# Patient Record
Sex: Male | Born: 1995 | Race: White | Hispanic: No | Marital: Single | Smoking: Never smoker
Health system: Southern US, Community
[De-identification: ages and names within clinical notes are randomized; demographics above are authoritative.]

## PROBLEM LIST (undated history)

## (undated) DIAGNOSIS — J45909 Unspecified asthma, uncomplicated: Secondary | ICD-10-CM

## (undated) DIAGNOSIS — D689 Coagulation defect, unspecified: Secondary | ICD-10-CM

## (undated) DIAGNOSIS — M86171 Other acute osteomyelitis, right ankle and foot: Secondary | ICD-10-CM

## (undated) DIAGNOSIS — G8221 Paraplegia, complete: Secondary | ICD-10-CM

## (undated) HISTORY — PX: ORIF HUMERUS FRACTURE: SHX2126

## (undated) HISTORY — DX: Paraplegia, complete: G82.21

## (undated) HISTORY — PX: ANTERIOR CERVICAL CORPECTOMY: SHX1159

## (undated) HISTORY — PX: FOOT SURGERY: SHX648

## (undated) HISTORY — PX: WISDOM TOOTH EXTRACTION: SHX21

## (undated) HISTORY — DX: Other acute osteomyelitis, right ankle and foot: M86.171

## (undated) HISTORY — DX: Coagulation defect, unspecified: D68.9

---
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
## 2015-04-06 ENCOUNTER — Other Ambulatory Visit: Payer: Self-pay | Admitting: Allergy and Immunology

## 2015-04-10 ENCOUNTER — Ambulatory Visit: Payer: Self-pay | Admitting: Allergy and Immunology

## 2016-03-15 ENCOUNTER — Encounter: Payer: BLUE CROSS/BLUE SHIELD | Attending: Family Medicine | Admitting: Skilled Nursing Facility1

## 2016-03-15 ENCOUNTER — Encounter: Payer: Self-pay | Admitting: Skilled Nursing Facility1

## 2016-03-15 DIAGNOSIS — E669 Obesity, unspecified: Secondary | ICD-10-CM | POA: Insufficient documentation

## 2016-03-15 DIAGNOSIS — Z713 Dietary counseling and surveillance: Secondary | ICD-10-CM | POA: Insufficient documentation

## 2016-03-15 DIAGNOSIS — E6609 Other obesity due to excess calories: Secondary | ICD-10-CM

## 2016-03-15 NOTE — Progress Notes (Signed)
  Medical Nutrition Therapy:  Appt start time: 3:02end time:  3:45   Assessment:  Primary concerns today: referred for obesity. Pt states he has been trying to lose wt for the past year. Pt states he was down to 160 pounds and is up to 211 pounds since March. Pt states 2-3 months 3 times a week but then stopped going to the gym for a few months while looking for work.  Pt states he plays on the computer a lot and sleeps about 10 hours waking at 4pm but has recently been changing his sleep to wake at 11pm.   Preferred Learning Style:   No preference indicated   Learning Readiness:   Contemplating  MEDICATIONS: See List   DIETARY INTAKE:  Usual eating pattern includes 2 meals and 0 snacks per day.  Everyday foods include none stated.  Avoided foods include none stated.    24-hr recall:  B ( AM): ramen---eggs Snk ( AM):   L ( PM):  Snk ( PM):  D ( PM): baked chicken---pasta with cream sauce-----vegetables Snk ( PM):  Beverages: water  Usual physical activity: ADL's *Meals outside the home: 2-3 meals a week  Estimated energy needs: 1800 calories 200 g carbohydrates 135 g protein 50 g fat  Progress Towards Goal(s):  In progress.   Nutritional Diagnosis:  Anton-3.3 Overweight/obesity As related to inconsistant meal times/physical inactviity.  As evidenced by pt report, 24 hr recall, and BMI 36.27.    Intervention:  Nutrition counseling for obesity. Dietitian educated the pt on balanced meals, meal frequency, and the importance of physical activity. Goals: -Keep using the weights at your computer  -Try to get to the gym 3 days a week 30-60 minutes   -Reevaluate the frequency, time frame, intensity of your workouts   -Keep working on your sleep schedule   -Try to eat within an hour to hour and a half of waking   --Eat 3 meals a day and snacks in between (if you are hungry for the snacks) -A meal: carbohydrate, protein, vegetable -A snack: A Fruit OR Vegetable AND  Protein -Honor your body by listening to your hunger and fullness cues -First thought: Am I hungry? -Second thought: (if the answer is yes I am hungry) Does this meal have vegetables? Protein? Carbohydrate?  -Third thought: Are there more vegetables on my plate compared to Protein and Carbohydrates? -After you have finished your first serving Do Not go back for more until you have waited 20-30 minutes and checked in with your body by asking Am I Still Hungry?   Teaching Method Utilized:  Visual Auditory Hands on  Handouts given during visit include:  Should I eat flow sheet  Barriers to learning/adherence to lifestyle change: none identified   Demonstrated degree of understanding via:  Teach Back   Monitoring/Evaluation:  Dietary intake, exercise, and body weight prn.

## 2016-03-15 NOTE — Patient Instructions (Signed)
-  Keep using the weights at your computer  -Try to get to the gym 3 days a week 30-60 minutes   -Reevaluate the frequency, time frame, intensity of your workouts   -Keep working on your sleep schedule   -Try to eat within an hour to hour and a half of waking   --Eat 3 meals a day and snacks in between (if you are hungry for the snacks) -A meal: carbohydrate, protein, vegetable -A snack: A Fruit OR Vegetable AND Protein -Honor your body by listening to your hunger and fullness cues -First thought: Am I hungry? -Second thought: (if the answer is yes I am hungry) Does this meal have vegetables? Protein? Carbohydrate?  -Third thought: Are there more vegetables on my plate compared to Protein and Carbohydrates? -After you have finished your first serving Do Not go back for more until you have waited 20-30 minutes and checked in with your body by asking Am I Still Hungry?

## 2016-06-24 ENCOUNTER — Ambulatory Visit (INDEPENDENT_AMBULATORY_CARE_PROVIDER_SITE_OTHER): Payer: BLUE CROSS/BLUE SHIELD | Admitting: Allergy and Immunology

## 2016-06-24 ENCOUNTER — Encounter: Payer: Self-pay | Admitting: Allergy and Immunology

## 2016-06-24 ENCOUNTER — Encounter (INDEPENDENT_AMBULATORY_CARE_PROVIDER_SITE_OTHER): Payer: Self-pay

## 2016-06-24 VITALS — BP 112/76 | HR 85 | Temp 98.1°F | Resp 17 | Ht 64.0 in | Wt 218.0 lb

## 2016-06-24 DIAGNOSIS — J4520 Mild intermittent asthma, uncomplicated: Secondary | ICD-10-CM | POA: Diagnosis not present

## 2016-06-24 DIAGNOSIS — J3089 Other allergic rhinitis: Secondary | ICD-10-CM

## 2016-06-24 MED ORDER — AZELASTINE-FLUTICASONE 137-50 MCG/ACT NA SUSP: 1.0000 | Freq: Two times a day (BID) | NASAL | 5 refills | Status: DC | PRN

## 2016-06-24 MED ORDER — AZELASTINE-FLUTICASONE 137-50 MCG/ACT NA SUSP: 1.0000 | Freq: Two times a day (BID) | NASAL | 3 refills | Status: DC | PRN

## 2016-06-24 NOTE — Progress Notes (Signed)
Follow-up Note  RE: Ivory BroadJoshua N Creps MRN: [id number] DOB: 02/22/1996 Date of Office Visit: 06/24/2016  Primary care provider: Iva BoopVIA, KEVIN, MD Referring provider: No ref. provider found  History of present illness: Dellis AnesJoshua Coker is a 21 y.o. male with persistent asthma and allergic rhinoconjunctivitis presenting today for follow up.  He was last seen in this clinic in August 2016 by Dr. Willa RoughHicks, who has since left the practice.  He has been experiencing persistent nasal congestion over the past month.  Atrovent nasal spray and cetirizine have not provided adequate symptom relief.  He had the flu last week and took Tamiflu.  He reports that his asthma has been well-controlled.  He requires albuterol rescue 1 or 2 times per week on average.   Assessment and plan: Mild intermittent asthma  Continue albuterol HFA, 1-2 inhalations every 4-6 hours as needed.  Subjective and objective measures of pulmonary function will be followed and the treatment plan will be adjusted accordingly.  Allergic rhinitis  Continue appropriate allergen avoidance measures.  A prescription has been provided for Dymista (azelastine/fluticasone) nasal spray, 1 spray per nostril twice daily as needed. Proper nasal spray technique has been discussed and demonstrated.  Nasal saline lavage (NeilMed) has been recommended prior to medicated nasal sprays and as needed along with instructions for proper administration.  For thick post nasal drainage, nasal congestion, and/or sinus pressure, add guaifenesin 1200 mg (Mucinex Maximum Strength) plus/minus pseudoephedrine 120 mg  twice daily as needed with adequate hydration as discussed. Pseudoephedrine is only to be used for short-term relief of nasal/sinus congestion. Long-term use is discouraged due to potential side effects.   Meds ordered this encounter  Medications  . DISCONTD: Azelastine-Fluticasone 137-50 MCG/ACT SUSP    Sig: Place 1 spray into the nose 2 (two)  times daily as needed.    Dispense:  1 Bottle    Refill:  5  . Azelastine-Fluticasone 137-50 MCG/ACT SUSP    Sig: Place 1 spray into the nose 2 (two) times daily as needed.    Dispense:  1 Bottle    Refill:  3    Diagnostics: Spirometry:  Normal with an FEV1 of 89% predicted.  Please see scanned spirometry results for details.    Physical examination: Blood pressure 112/76, pulse 85, temperature 98.1 F (36.7 C), temperature source Oral, resp. rate 17, height 5\' 4"  (1.626 m), weight 218 lb (98.9 kg), SpO2 97 %.  General: Alert, interactive, in no acute distress. HEENT: TMs pearly gray, turbinates edematous with clear discharge, post-pharynx mildly erythematous. Neck: Supple without lymphadenopathy. Lungs: Clear to auscultation without wheezing, rhonchi or rales. CV: Normal S1, S2 without murmurs. Skin: Warm and dry, without lesions or rashes.  The following portions of the patient's history were reviewed and updated as appropriate: allergies, current medications, past family history, past medical history, past social history, past surgical history and problem list.  Allergies as of 06/24/2016      Reactions   Cephalexin    Penicillins    Mom is allergic      Medication List       Accurate as of 06/24/16  5:07 PM. Always use your most recent med list.          Azelastine-Fluticasone 137-50 MCG/ACT Susp Place 1 spray into the nose 2 (two) times daily as needed.   cetirizine 10 MG tablet Commonly known as:  ZYRTEC TK 1 T PO QD   ipratropium 0.06 % nasal spray Commonly known as:  ATROVENT  ONE-A-DAY TEEN ADVANTAGE/HIM PO Take 1 tablet by mouth daily.   PROAIR HFA 108 (90 Base) MCG/ACT inhaler Generic drug:  albuterol INHALE 2 PUFFS BY MOUTH EVERY 4 TO 6 HOURS AS NEEDED FOR COUGH OR WHEEZE. MAY USE 2 PUFFS 10 TO 20 MINTUES PRIOR TO EXERCISE       Allergies  Allergen Reactions  . Cephalexin   . Penicillins     Mom is allergic    I appreciate the  opportunity to take part in Dewayne's care. Please do not hesitate to contact me with questions.  Sincerely,   R. Jorene Guest, MD

## 2016-06-24 NOTE — Assessment & Plan Note (Signed)
   Continue albuterol HFA, 1-2 inhalations every 4-6 hours as needed.  Subjective and objective measures of pulmonary function will be followed and the treatment plan will be adjusted accordingly. 

## 2016-06-24 NOTE — Patient Instructions (Addendum)
Mild intermittent asthma  Continue albuterol HFA, 1-2 inhalations every 4-6 hours as needed.  Subjective and objective measures of pulmonary function will be followed and the treatment plan will be adjusted accordingly.  Allergic rhinitis  Continue appropriate allergen avoidance measures.  A prescription has been provided for Dymista (azelastine/fluticasone) nasal spray, 1 spray per nostril twice daily as needed. Proper nasal spray technique has been discussed and demonstrated.  Nasal saline lavage (NeilMed) has been recommended prior to medicated nasal sprays and as needed along with instructions for proper administration.  For thick post nasal drainage, nasal congestion, and/or sinus pressure, add guaifenesin 1200 mg (Mucinex Maximum Strength) plus/minus pseudoephedrine 120 mg  twice daily as needed with adequate hydration as discussed. Pseudoephedrine is only to be used for short-term relief of nasal/sinus congestion. Long-term use is discouraged due to potential side effects.   Return in about 6 months (around 12/22/2016), or if symptoms worsen or fail to improve.

## 2016-06-24 NOTE — Assessment & Plan Note (Signed)
   Continue appropriate allergen avoidance measures.  A prescription has been provided for Dymista (azelastine/fluticasone) nasal spray, 1 spray per nostril twice daily as needed. Proper nasal spray technique has been discussed and demonstrated.  Nasal saline lavage (NeilMed) has been recommended prior to medicated nasal sprays and as needed along with instructions for proper administration.  For thick post nasal drainage, nasal congestion, and/or sinus pressure, add guaifenesin 1200 mg (Mucinex Maximum Strength) plus/minus pseudoephedrine 120 mg  twice daily as needed with adequate hydration as discussed. Pseudoephedrine is only to be used for short-term relief of nasal/sinus congestion. Long-term use is discouraged due to potential side effects.

## 2016-06-28 ENCOUNTER — Other Ambulatory Visit: Payer: Self-pay | Admitting: Allergy and Immunology

## 2016-06-28 NOTE — Telephone Encounter (Signed)
Do you want me to just split the prescriptions.

## 2016-06-28 NOTE — Telephone Encounter (Signed)
patients insurance will NOT pay for new nasal spray   - patients states that the pharmacy says that [facility] can try and have insurance pay for new nasal spray or patient is fine having the two generic sprays instead.  Patient states he was to call back and let [facility] know if the spray was covered which it is not.  Please call patient to let him know if [facility] will try and get the new spray authorized and covered - or will the other two sprays be called in Patient wants to switch pharmacies to [facility] on [address].

## 2016-06-30 ENCOUNTER — Other Ambulatory Visit: Payer: Self-pay | Admitting: *Deleted

## 2016-06-30 MED ORDER — AZELASTINE HCL 0.15 % NA SOLN: 2.0000 | Freq: Two times a day (BID) | NASAL | 5 refills | Status: DC

## 2016-06-30 MED ORDER — FLUTICASONE PROPIONATE 50 MCG/ACT NA SUSP: 1.0000 | Freq: Every day | NASAL | 5 refills | Status: DC

## 2016-07-14 ENCOUNTER — Other Ambulatory Visit: Payer: Self-pay | Admitting: *Deleted

## 2016-07-23 NOTE — Telephone Encounter (Signed)
I am sorry, this must have went to the wrong person. Please advise and thank you.

## 2016-07-26 NOTE — Telephone Encounter (Signed)
Yes, give a script for azelastine and fluticasone nasal sprays. Thanks.

## 2016-07-26 NOTE — Telephone Encounter (Signed)
Medications have already been sent

## 2018-10-05 ENCOUNTER — Encounter: Payer: Self-pay | Admitting: Allergy and Immunology

## 2018-10-05 ENCOUNTER — Other Ambulatory Visit: Payer: Self-pay

## 2018-10-05 ENCOUNTER — Ambulatory Visit (INDEPENDENT_AMBULATORY_CARE_PROVIDER_SITE_OTHER): Payer: BLUE CROSS/BLUE SHIELD | Admitting: Allergy and Immunology

## 2018-10-05 VITALS — BP 122/78 | HR 72 | Temp 97.8°F | Resp 22 | Ht 65.5 in | Wt 223.2 lb

## 2018-10-05 DIAGNOSIS — J3089 Other allergic rhinitis: Secondary | ICD-10-CM | POA: Diagnosis not present

## 2018-10-05 DIAGNOSIS — J301 Allergic rhinitis due to pollen: Secondary | ICD-10-CM

## 2018-10-05 DIAGNOSIS — J4530 Mild persistent asthma, uncomplicated: Secondary | ICD-10-CM | POA: Diagnosis not present

## 2018-10-05 DIAGNOSIS — L858 Other specified epidermal thickening: Secondary | ICD-10-CM | POA: Diagnosis not present

## 2018-10-05 MED ORDER — MONTELUKAST SODIUM 10 MG PO TABS: 10.0000 mg | ORAL_TABLET | Freq: Every day | ORAL | 5 refills | Status: DC

## 2018-10-05 MED ORDER — AMMONIUM LACTATE 12 % EX CREA: TOPICAL_CREAM | CUTANEOUS | 3 refills | Status: DC

## 2018-10-05 NOTE — Progress Notes (Signed)
[facility] [place]   Follow-up Note  Referring Provider: ViaCaryn Bee, Kevin, MD Primary Provider: Ayesha RumpfYonjof, Mary, FNP Date of Office Visit: 10/05/2018  Subjective:   Ian Mcdaniel (DOB: 10/23/1995) is a 23 y.o. male who [facility] in re-evaluation of the following:  HPI: Ian Mcdaniel returns to this clinic in evaluation of asthma and allergic rhinoconjunctivitis.  He was last seen in this clinic by Dr. Nunzio CobbsBobbitt on 22 August 2016.  Ian Mcdaniel has been having problems with his asthma.  His requirement for short acting bronchodilator sometimes goes up to twice a day.  His symptoms occur on a perennial basis but definitely flare at the beginning of the spring and the beginning of the fall.  It does not sound as though he has required a systemic steroid to treat an exacerbation of his asthma in years.  He does not use a controller agent.  He also has issues with intermittent nasal congestion and sneezing and bags around his eyes.  He only uses nasal fluticasone intermittently.  He does continue on an antihistamine.  He continues to have problems with keratosis pilaris.  He is not using any type of therapy for this issue.  Allergies as of 10/05/2018      Reactions   Cephalexin    Penicillins    Mom is allergic      Medication List      cetirizine 10 MG tablet Commonly known as:  ZYRTEC TK 1 T PO QD   fluticasone 50 MCG/ACT nasal spray Commonly known as:  FLONASE Place 1-2 sprays into both nostrils daily.   ProAir HFA 108 (90 Base) MCG/ACT inhaler Generic drug:  albuterol INHALE 2 PUFFS BY MOUTH EVERY 4 TO 6 HOURS AS NEEDED FOR COUGH OR WHEEZE. MAY USE 2 PUFFS 10 TO 20 MINTUES PRIOR TO EXERCISE   PROBIOTIC DAILY PO Take by mouth.       Past Medical History:  Diagnosis Date  . Asthma     History reviewed. No pertinent surgical history.  Review of systems negative except as noted in HPI / PMHx or noted  below:  Review of Systems  Constitutional: Negative.   HENT: Negative.   Eyes: Negative.   Respiratory: Negative.   Cardiovascular: Negative.   Gastrointestinal: Negative.   Genitourinary: Negative.   Musculoskeletal: Negative.   Skin: Negative.   Neurological: Negative.   Endo/Heme/Allergies: Negative.   Psychiatric/Behavioral: Negative.      Objective:   Vitals:   10/05/18 1557  BP: 122/78  Pulse: 72  Resp: (!) 22  Temp: 97.8 F (36.6 C)  SpO2: 96%   Height: 5' 5.5" (166.4 cm)  Weight: 223 lb 3.2 oz (101.2 kg)   Physical Exam Constitutional:      Appearance: He is not diaphoretic.  HENT:     Head: Normocephalic.     Right Ear: Tympanic membrane, ear canal and external ear normal.     Left Ear: Tympanic membrane, ear canal and external ear normal.     Nose: Nose normal. No mucosal edema or rhinorrhea.     Mouth/Throat:     Pharynx: Uvula midline. No oropharyngeal exudate.  Eyes:     Conjunctiva/sclera: Conjunctivae normal.  Neck:     Thyroid: No thyromegaly.     Trachea: Trachea normal. No tracheal tenderness or tracheal deviation.  Cardiovascular:     Rate and Rhythm: Normal rate and regular rhythm.     Heart  sounds: Normal heart sounds, S1 normal and S2 normal. No murmur.  Pulmonary:     Effort: No respiratory distress.     Breath sounds: Normal breath sounds. No stridor. No wheezing or rales.  Lymphadenopathy:     Head:     Right side of head: No tonsillar adenopathy.     Left side of head: No tonsillar adenopathy.     Cervical: No cervical adenopathy.  Skin:    Findings: Rash (Keratosis pilaris posterior upper arms bilaterally) present. No erythema.     Nails: There is no clubbing.   Neurological:     Mental Status: He is alert.     Diagnostics:    Spirometry was performed and demonstrated an FEV1 of 3.29 at 80 % of predicted.  Assessment and Plan:   1. Not well controlled mild persistent asthma   2. Perennial allergic rhinitis   3.  Seasonal allergic rhinitis due to pollen   4. Keratosis pilaris     1.  Treat and prevent inflammation:   A.  OTC Flonase-1 spray each nostril 3-7 times a week  B.  Montelukast 10 mg- 1 tablet daily  2.  If needed:   A.  OTC antihistamine-cetirizine 10 mg daily  B.  Albuterol HFA 2 inhalations every 4-6 hours  C.  OTC Pataday 1 drop each eye 1 time per day  D.  Lac-Hydrin applied to keratosis pilaris daily  3.  Further evaluation for asthma activity?  4.  Return to clinic summer 2020 or earlier if problem  Rodricus appears to have some activity of his atopic respiratory disease and we will treat him with a combination of Flonase and montelukast used on a consistent basis.  He has several other medications that he can utilize on an as-needed basis.  Assuming he does well I will see him back in this clinic in the summer 2020 or earlier if there is a problem.  Laurette Schimke, MD Allergy / Immunology [facility]

## 2018-10-05 NOTE — Patient Instructions (Addendum)
  1.  Treat and prevent inflammation:   A.  OTC Flonase-1 spray each nostril 3-7 times a week  B.  Montelukast 10 mg- 1 tablet daily  2.  If needed:   A.  OTC antihistamine-cetirizine 10 mg daily  B.  Albuterol HFA 2 inhalations every 4-6 hours  C.  OTC Pataday 1 drop each eye 1 time per day  D.  Lac-Hydrin ointment applied to keratosis pilaris daily  3.  Further evaluation for asthma activity?  4.  Return to clinic summer 2020 or earlier if problem

## 2018-10-09 ENCOUNTER — Encounter: Payer: Self-pay | Admitting: Allergy and Immunology

## 2018-11-08 ENCOUNTER — Encounter: Payer: Self-pay | Admitting: Allergy and Immunology

## 2018-11-08 ENCOUNTER — Other Ambulatory Visit: Payer: Self-pay

## 2018-11-08 ENCOUNTER — Telehealth: Payer: Self-pay | Admitting: Allergy and Immunology

## 2018-11-08 ENCOUNTER — Ambulatory Visit (INDEPENDENT_AMBULATORY_CARE_PROVIDER_SITE_OTHER): Payer: BC Managed Care – PPO | Admitting: Allergy and Immunology

## 2018-11-08 VITALS — BP 120/72 | HR 69 | Temp 98.6°F | Resp 18

## 2018-11-08 DIAGNOSIS — L858 Other specified epidermal thickening: Secondary | ICD-10-CM

## 2018-11-08 DIAGNOSIS — J301 Allergic rhinitis due to pollen: Secondary | ICD-10-CM | POA: Diagnosis not present

## 2018-11-08 DIAGNOSIS — J3089 Other allergic rhinitis: Secondary | ICD-10-CM | POA: Diagnosis not present

## 2018-11-08 DIAGNOSIS — J4530 Mild persistent asthma, uncomplicated: Secondary | ICD-10-CM | POA: Diagnosis not present

## 2018-11-08 MED ORDER — ARNUITY ELLIPTA 100 MCG/ACT IN AEPB: 1.0000 | INHALATION_SPRAY | Freq: Every day | RESPIRATORY_TRACT | 5 refills | Status: DC

## 2018-11-08 NOTE — Progress Notes (Signed)
[place] [facility] [place]   Follow-up Note  Referring Provider: Christa See, FNP Primary Provider: Christa See, FNP Date of Office Visit: 11/08/2018  Subjective:   Ian Mcdaniel (DOB: October 09, 1995) is a 23 y.o. male who returns to the [facility] [place] on 11/08/2018 in re-evaluation of the following:  HPI: Ian Mcdaniel returns to this clinic in reevaluation of his asthma and allergic rhinitis and keratosis pilaris.  I last saw him in this clinic on 05 October 2018 at which point in time he was having active airway disease.  We started him on a nasal steroid and montelukast at that point.  Unfortunately, he still continues to have intermittent coughing and he does not really think that montelukast has helped him very much.  His requirement for bronchodilator is about 5 times per week.  He has not required the administration of a systemic steroid to treat an exacerbation of asthma.  His nose may be slightly better with the use of the nasal steroid.  Fortunately, there is no significant respiratory tract symptoms to suggest an ongoing episode of sinusitis.  Allergies as of 11/08/2018      Reactions   Cephalexin    Penicillins    Mom is allergic      Medication List      ammonium lactate 12 % cream Commonly known as: AMLACTIN Can apply to keratosis pilaris once daily if needed.     cetirizine 10 MG tablet Commonly known as: ZYRTEC TK 1 T PO QD   fluticasone 50 MCG/ACT nasal spray Commonly known as: FLONASE Place 1-2 sprays into both nostrils daily.   montelukast 10 MG tablet Commonly known as: SINGULAIR Take 1 tablet (10 mg total) by mouth at bedtime.   ProAir HFA 108 (90 Base) MCG/ACT inhaler Generic drug: albuterol INHALE 2 PUFFS BY MOUTH EVERY 4 TO 6 HOURS AS NEEDED FOR COUGH OR WHEEZE. MAY USE 2 PUFFS 10 TO 20 MINTUES PRIOR TO EXERCISE   PROBIOTIC DAILY PO Take by mouth.       Past Medical History:  Diagnosis Date  . Asthma      History reviewed. No pertinent surgical history.  Review of systems negative except as noted in HPI / PMHx or noted below:  Review of Systems  Constitutional: Negative.   HENT: Negative.   Eyes: Negative.   Respiratory: Negative.   Cardiovascular: Negative.   Gastrointestinal: Negative.   Genitourinary: Negative.   Musculoskeletal: Negative.   Skin: Negative.   Neurological: Negative.   Endo/Heme/Allergies: Negative.   Psychiatric/Behavioral: Negative.      Objective:   Vitals:   11/08/18 1135  BP: 120/72  Pulse: 69  Resp: 18  Temp: 98.6 F (37 C)  SpO2: 96%          Physical Exam Constitutional:      Appearance: He is not diaphoretic.  HENT:     Head: Normocephalic.     Right Ear: Tympanic membrane, ear canal and external ear normal.     Left Ear: Tympanic membrane, ear canal and external ear normal.     Nose: Nose normal. No mucosal edema or rhinorrhea.     Mouth/Throat:     Pharynx: Uvula midline. No oropharyngeal exudate.  Eyes:     Conjunctiva/sclera: Conjunctivae normal.  Neck:     Thyroid: No thyromegaly.     Trachea: Trachea normal. No tracheal tenderness or tracheal deviation.  Cardiovascular:     Rate and Rhythm: Normal rate  and regular rhythm.     Heart sounds: Normal heart sounds, S1 normal and S2 normal. No murmur.  Pulmonary:     Effort: No respiratory distress.     Breath sounds: Normal breath sounds. No stridor. No wheezing or rales.  Lymphadenopathy:     Head:     Right side of head: No tonsillar adenopathy.     Left side of head: No tonsillar adenopathy.     Cervical: No cervical adenopathy.  Skin:    Findings: No erythema or rash.     Nails: There is no clubbing.   Neurological:     Mental Status: He is alert.     Diagnostics:    Spirometry was performed and demonstrated an FEV1 of 3.02 at 78 % of predicted.   Assessment and Plan:   1. Not well controlled mild persistent asthma   2. Perennial allergic rhinitis   3.  Seasonal allergic rhinitis due to pollen   4. Keratosis pilaris     1.  Treat and prevent inflammation:   A.  OTC Flonase-1 spray each nostril 3-7 times a week  B.  Arnuity 100 - 1 inhalation 1 time per day  C.  Can discontinue montelukast  2.  If needed:   A.  OTC antihistamine-cetirizine 10 mg daily  B.  Albuterol HFA 2 inhalations every 4-6 hours  C.  OTC Pataday 1 drop each eye 1 time per day  D.  Lac-Hydrin ointment applied to keratosis pilaris daily  3.  Return to clinic 8 weeks or earlier if problem  Ian Mcdaniel has not had a good response to initial therapy directed against his respiratory tract inflammatory and we will now have him utilize a nasal steroid and inhaled steroid on a consistent basis for the next several weeks and assess his response to that approach in approximately 8 weeks.  Montelukast does not appear to have resulted in good control of any of his issues and he can discontinue this agent.  I will see him back in this clinic 8 weeks or earlier if there is a problem.  Laurette SchimkeEric Kyion Gautier, MD Allergy / Immunology [facility]

## 2018-11-08 NOTE — Telephone Encounter (Signed)
Ian Mcdaniel came in for his appointment this morning and questioned his bill.  He states that his insurance didn't pay very much.  I asked him if he had a deductible and she stated he wasn't sure.  He didn't understand why the insurance paid so little and why was he responsible for such a huge portion.  Please advise.

## 2018-11-08 NOTE — Patient Instructions (Signed)
  1.  Treat and prevent inflammation:   A.  OTC Flonase-1 spray each nostril 3-7 times a week  B.  Arnuity 100 - 1 inhalation 1 time per day  C.  Can discontinue montelukast  2.  If needed:   A.  OTC antihistamine-cetirizine 10 mg daily  B.  Albuterol HFA 2 inhalations every 4-6 hours  C.  OTC Pataday 1 drop each eye 1 time per day  D.  Lac-Hydrin ointment applied to keratosis pilaris daily  3.  Return to clinic 8 weeks or earlier if problem

## 2018-11-09 ENCOUNTER — Encounter: Payer: Self-pay | Admitting: Allergy and Immunology

## 2018-11-09 NOTE — Telephone Encounter (Signed)
I spoke with Ian Mcdaniel this evening. Thank you.

## 2020-03-10 ENCOUNTER — Other Ambulatory Visit: Payer: Self-pay | Admitting: Allergy and Immunology

## 2020-03-10 NOTE — Telephone Encounter (Signed)
Please inform Ian Mcdaniel that medically likely we need to see him 1 time per year to brought him medications throughout that year.  It does not need to be [place] to renew his medications and if he has seen a additional physician or provider within the past year they may be able to provide him refills of his medications.  We can provide him one courtesy refill without seeing him again.

## 2020-03-10 NOTE — Telephone Encounter (Signed)
Patient is wanting refill on Arnuity. Patient has not been in the office since 11/2018 and does not want to schedule an appointment and stated "is this necessary". Advise patient in order to get an refill he needed the appointment. Patient stated he has not been consist with his medication and lately has had coughing probably due to allergies. I advise per protocol I will have to ask the provider and stated to give him a call back. Please advise.

## 2020-03-11 NOTE — Telephone Encounter (Signed)
Spoke with patient, he stated that he greatly appreciates the courtesy refill. He stated that right now is hard for him to come in for an office visit due to it costing him between $300-$400 and then an additional $50 for filling the prescription. Patient stated he will do his best to get in as soon as possible to continue receiving refills.

## 2020-04-02 ENCOUNTER — Other Ambulatory Visit: Payer: Self-pay | Admitting: Allergy and Immunology

## 2020-04-02 NOTE — Telephone Encounter (Signed)
Patient has appointment to see Nehemiah Settle, FNP on 04/07/2020 at 950 Am

## 2020-04-07 ENCOUNTER — Ambulatory Visit: Payer: Self-pay | Admitting: Family

## 2020-04-10 ENCOUNTER — Ambulatory Visit (INDEPENDENT_AMBULATORY_CARE_PROVIDER_SITE_OTHER): Payer: BC Managed Care – PPO | Admitting: Allergy and Immunology

## 2020-04-10 ENCOUNTER — Encounter: Payer: Self-pay | Admitting: Allergy and Immunology

## 2020-04-10 ENCOUNTER — Other Ambulatory Visit: Payer: Self-pay

## 2020-04-10 VITALS — BP 118/88 | HR 71 | Resp 16 | Ht 64.6 in | Wt 246.0 lb

## 2020-04-10 DIAGNOSIS — J4530 Mild persistent asthma, uncomplicated: Secondary | ICD-10-CM | POA: Diagnosis not present

## 2020-04-10 DIAGNOSIS — J301 Allergic rhinitis due to pollen: Secondary | ICD-10-CM

## 2020-04-10 DIAGNOSIS — J3089 Other allergic rhinitis: Secondary | ICD-10-CM | POA: Diagnosis not present

## 2020-04-10 MED ORDER — ALBUTEROL SULFATE HFA 108 (90 BASE) MCG/ACT IN AERS: INHALATION_SPRAY | RESPIRATORY_TRACT | 1 refills | Status: AC

## 2020-04-10 MED ORDER — MONTELUKAST SODIUM 10 MG PO TABS: 10.0000 mg | ORAL_TABLET | Freq: Every day | ORAL | 3 refills | Status: DC

## 2020-04-10 MED ORDER — ARNUITY ELLIPTA 100 MCG/ACT IN AEPB: INHALATION_SPRAY | RESPIRATORY_TRACT | 3 refills | Status: DC

## 2020-04-10 NOTE — Progress Notes (Signed)
[place]   Follow-up Note  Referring Provider: Ayesha Rumpf, FNP Primary Provider: Ayesha Rumpf, FNP Date of Office Visit: 04/10/2020  Subjective:   Ian Mcdaniel (DOB: 04/09/1996) is a 24 y.o. male [facility] in re-evaluation of the following:  HPI: [name] returns to this clinic in evaluation of asthma and allergic rhinitis and history of keratosis pilaris.  His last visit to this clinic was 08 November 2018.  As long as he continues on Arnuity and montelukast he has very little problems with either his upper or lower airways and has not required a systemic steroid or antibiotics since last being seen in this clinic and rarely uses a short acting bronchodilator.  If he misses his Arnuity for a period in time then he develops wheezing and coughing and must use a short acting bronchodilator.  Allergies as of 04/10/2020      Reactions   Cephalexin    Penicillins    Mom is allergic      Medication List      Arnuity Ellipta 100 MCG/ACT Aepb Generic drug: Fluticasone Furoate INHALE 1 PUFF BY MOUTH EVERY DAY   montelukast 10 MG tablet Commonly known as: SINGULAIR Take 1 tablet (10 mg total) by mouth at bedtime.   ProAir HFA 108 (90 Base) MCG/ACT inhaler Generic drug: albuterol INHALE 2 PUFFS BY MOUTH EVERY 4 TO 6 HOURS AS NEEDED FOR COUGH OR WHEEZE. MAY USE 2 PUFFS 10 TO 20 MINTUES PRIOR TO EXERCISE       Past Medical History:  Diagnosis Date  . Asthma     History reviewed. No pertinent surgical history.  Review of systems negative except as noted in HPI / PMHx or noted below:  Review of Systems  Constitutional: Negative.   HENT: Negative.   Eyes: Negative.   Respiratory: Negative.   Cardiovascular: Negative.   Gastrointestinal: Negative.   Genitourinary: Negative.   Musculoskeletal: Negative.   Skin: Negative.   Neurological: Negative.   Endo/Heme/Allergies:  Negative.   Psychiatric/Behavioral: Negative.      Objective:   Vitals:   04/10/20 0830  BP: 118/88  Pulse: 71  Resp: 16  SpO2: 97%   Height: 5' 4.6" (164.1 cm)  Weight: 246 lb (111.6 kg)   Physical Exam Constitutional:      Appearance: He is not diaphoretic.  HENT:     Head: Normocephalic.     Right Ear: Tympanic membrane, ear canal and external ear normal.     Left Ear: Tympanic membrane, ear canal and external ear normal.     Nose: Nose normal. No mucosal edema or rhinorrhea.     Mouth/Throat:     Mouth: Oropharynx is clear and moist and mucous membranes are normal.     Pharynx: Uvula midline. No oropharyngeal exudate.  Eyes:     Conjunctiva/sclera: Conjunctivae normal.  Neck:     Thyroid: No thyromegaly.     Trachea: Trachea normal. No tracheal tenderness or tracheal deviation.  Cardiovascular:     Rate and Rhythm: Normal rate and regular rhythm.     Heart sounds: Normal heart sounds, S1 normal and S2 normal. No murmur heard.   Pulmonary:     Effort: No respiratory distress.     Breath sounds: Normal breath sounds. No stridor. No wheezing or rales.  Musculoskeletal:        General: No edema.  Lymphadenopathy:     Head:  Right side of head: No tonsillar adenopathy.     Left side of head: No tonsillar adenopathy.     Cervical: No cervical adenopathy.  Skin:    Findings: No erythema or rash.     Nails: There is no clubbing.  Neurological:     Mental Status: He is alert.     Diagnostics:    Spirometry was performed and demonstrated an FEV1 of 3.68 at 93 % of predicted.  Assessment and Plan:   1. Asthma, well controlled, mild persistent   2. Perennial allergic rhinitis   3. Seasonal allergic rhinitis due to pollen     1.  Treat and prevent inflammation:   A.  Arnuity 100 - 1 inhalation 1 time per day  C.  Montelukast 10 mg - 1 tablet 1 time per day  2.  If needed:   A.  OTC antihistamine-cetirizine 10 mg daily  B.  Albuterol HFA 2  inhalations every 4-6 hours  3. Obtain fall flu vaccine and covid vaccines  4.  Return to clinic 12 months or earlier if problem  Ewing appears to be doing very well as long as he continues on a combination of Arnuity and montelukast and we refilled his medications during today's visit.  He has a very good understanding of his disease state and how his medications work and we will see him back in this clinic in 12 months or earlier if there is a problem.  Laurette Schimke, MD Allergy / Immunology [facility]

## 2020-04-10 NOTE — Patient Instructions (Addendum)
  1.  Treat and prevent inflammation:   A.  Arnuity 100 - 1 inhalation 1 time per day  C.  Montelukast 10 mg - 1 tablet 1 time per day  2.  If needed:   A.  OTC antihistamine-cetirizine 10 mg daily  B.  Albuterol HFA 2 inhalations every 4-6 hours  3. Obtain fall flu vaccine and covid vaccines  4.  Return to clinic 12 months or earlier if problem

## 2020-04-14 ENCOUNTER — Encounter: Payer: Self-pay | Admitting: Allergy and Immunology

## 2020-09-15 ENCOUNTER — Telehealth: Payer: Self-pay | Admitting: Allergy and Immunology

## 2020-09-15 NOTE — Telephone Encounter (Signed)
Patient said on his last visit with Dr. Lucie Leather that he recommend a doctor to him for Primary Care. He would like to know who that would be, he couldn't remember what Dr. Lucie Leather said.

## 2020-09-15 NOTE — Telephone Encounter (Signed)
Please advise 

## 2020-09-22 NOTE — Telephone Encounter (Signed)
Pt informed of Dr. Johney Maine recommendation.

## 2020-09-22 NOTE — Telephone Encounter (Signed)
Please inform Denham that I looked into a few doctors and I think if he can get in with Dr. Irena Reichmann that would be very good.

## 2021-05-14 ENCOUNTER — Encounter: Payer: Self-pay | Admitting: Physical Medicine and Rehabilitation

## 2021-06-03 ENCOUNTER — Other Ambulatory Visit: Payer: Self-pay

## 2021-06-03 ENCOUNTER — Encounter: Payer: Self-pay | Admitting: Physical Medicine and Rehabilitation

## 2021-06-03 ENCOUNTER — Encounter
Payer: Medicaid Other | Attending: Physical Medicine and Rehabilitation | Admitting: Physical Medicine and Rehabilitation

## 2021-06-03 VITALS — BP 128/85 | HR 89 | Ht 64.6 in

## 2021-06-03 DIAGNOSIS — R252 Cramp and spasm: Secondary | ICD-10-CM | POA: Insufficient documentation

## 2021-06-03 DIAGNOSIS — Z993 Dependence on wheelchair: Secondary | ICD-10-CM | POA: Diagnosis present

## 2021-06-03 DIAGNOSIS — G8221 Paraplegia, complete: Secondary | ICD-10-CM | POA: Diagnosis present

## 2021-06-03 DIAGNOSIS — K592 Neurogenic bowel, not elsewhere classified: Secondary | ICD-10-CM | POA: Diagnosis present

## 2021-06-03 DIAGNOSIS — N319 Neuromuscular dysfunction of bladder, unspecified: Secondary | ICD-10-CM | POA: Diagnosis present

## 2021-06-03 DIAGNOSIS — G8250 Quadriplegia, unspecified: Secondary | ICD-10-CM | POA: Diagnosis present
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
## 2021-06-11 ENCOUNTER — Encounter: Payer: Self-pay | Admitting: Physical Medicine and Rehabilitation

## 2021-08-11 ENCOUNTER — Encounter: Payer: Self-pay | Admitting: Physical Medicine and Rehabilitation

## 2021-09-07 ENCOUNTER — Encounter
Payer: Medicaid Other | Attending: Physical Medicine and Rehabilitation | Admitting: Physical Medicine and Rehabilitation

## 2021-09-07 ENCOUNTER — Encounter: Payer: Self-pay | Admitting: Physical Medicine and Rehabilitation

## 2021-09-07 VITALS — BP 122/84 | HR 109

## 2021-09-07 DIAGNOSIS — G8221 Paraplegia, complete: Secondary | ICD-10-CM | POA: Diagnosis present

## 2021-09-07 DIAGNOSIS — M792 Neuralgia and neuritis, unspecified: Secondary | ICD-10-CM | POA: Diagnosis present

## 2021-09-07 DIAGNOSIS — Z993 Dependence on wheelchair: Secondary | ICD-10-CM | POA: Diagnosis present

## 2021-09-07 DIAGNOSIS — K592 Neurogenic bowel, not elsewhere classified: Secondary | ICD-10-CM | POA: Insufficient documentation

## 2021-09-07 DIAGNOSIS — N319 Neuromuscular dysfunction of bladder, unspecified: Secondary | ICD-10-CM | POA: Insufficient documentation

## 2021-09-07 DIAGNOSIS — I82563 Chronic embolism and thrombosis of calf muscular vein, bilateral: Secondary | ICD-10-CM | POA: Insufficient documentation

## 2021-09-07 DIAGNOSIS — R252 Cramp and spasm: Secondary | ICD-10-CM | POA: Insufficient documentation

## 2021-09-07 DIAGNOSIS — R600 Localized edema: Secondary | ICD-10-CM | POA: Insufficient documentation

## 2021-09-07 DIAGNOSIS — G8250 Quadriplegia, unspecified: Secondary | ICD-10-CM | POA: Diagnosis present
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
## 2021-10-06 ENCOUNTER — Encounter: Payer: Self-pay | Admitting: Physical Medicine and Rehabilitation

## 2021-10-09 ENCOUNTER — Telehealth: Payer: Self-pay

## 2021-10-09 NOTE — Telephone Encounter (Signed)
Pt called requesting Dr. Berline Chough to call him to discuss a clinical trial he found for himself.

## 2021-12-25 ENCOUNTER — Encounter
Payer: Medicaid Other | Attending: Physical Medicine and Rehabilitation | Admitting: Physical Medicine and Rehabilitation

## 2021-12-25 ENCOUNTER — Encounter: Payer: Self-pay | Admitting: Physical Medicine and Rehabilitation

## 2021-12-25 VITALS — BP 120/84 | HR 86 | Ht 64.6 in

## 2021-12-25 DIAGNOSIS — G8250 Quadriplegia, unspecified: Secondary | ICD-10-CM | POA: Diagnosis present

## 2021-12-25 DIAGNOSIS — R252 Cramp and spasm: Secondary | ICD-10-CM | POA: Insufficient documentation

## 2021-12-25 DIAGNOSIS — N319 Neuromuscular dysfunction of bladder, unspecified: Secondary | ICD-10-CM | POA: Diagnosis present

## 2021-12-25 DIAGNOSIS — M792 Neuralgia and neuritis, unspecified: Secondary | ICD-10-CM | POA: Diagnosis present

## 2021-12-25 DIAGNOSIS — Z993 Dependence on wheelchair: Secondary | ICD-10-CM | POA: Insufficient documentation

## 2021-12-25 DIAGNOSIS — G8221 Paraplegia, complete: Secondary | ICD-10-CM | POA: Insufficient documentation

## 2021-12-25 MED ORDER — DULOXETINE HCL 60 MG PO CPEP: 60.0000 mg | ORAL_CAPSULE | Freq: Every day | ORAL | 3 refills | Status: DC

## 2021-12-25 MED ORDER — GABAPENTIN 600 MG PO TABS: 1200.0000 mg | ORAL_TABLET | Freq: Three times a day (TID) | ORAL | 3 refills | Status: DC
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
## 2022-02-10 HISTORY — PX: TOE AMPUTATION: SHX809

## 2022-03-03 ENCOUNTER — Telehealth: Payer: Self-pay
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
## 2022-03-04 NOTE — Telephone Encounter (Signed)
Have called pt- he wanted referral to therapy- explained- that needs to come via PCP- he wants specifically to go to therapist his grandmother is seeing. Also wants me to direct his GF on how to bowel program- explained I will see him Monday and happy to educate her, but cannot "make" her do bowel program- Will check on dig stim stick- ML

## 2022-03-08 ENCOUNTER — Encounter: Payer: Self-pay | Admitting: Physical Medicine and Rehabilitation

## 2022-03-08 ENCOUNTER — Encounter
Payer: Medicaid Other | Attending: Physical Medicine and Rehabilitation | Admitting: Physical Medicine and Rehabilitation

## 2022-03-08 VITALS — BP 134/86 | HR 92 | Ht 66.0 in | Wt 290.0 lb

## 2022-03-08 DIAGNOSIS — G8221 Paraplegia, complete: Secondary | ICD-10-CM | POA: Diagnosis not present

## 2022-03-08 DIAGNOSIS — Z993 Dependence on wheelchair: Secondary | ICD-10-CM | POA: Diagnosis present

## 2022-03-08 DIAGNOSIS — M792 Neuralgia and neuritis, unspecified: Secondary | ICD-10-CM

## 2022-03-08 DIAGNOSIS — N319 Neuromuscular dysfunction of bladder, unspecified: Secondary | ICD-10-CM

## 2022-03-08 DIAGNOSIS — R252 Cramp and spasm: Secondary | ICD-10-CM | POA: Diagnosis present

## 2022-03-08 DIAGNOSIS — G8250 Quadriplegia, unspecified: Secondary | ICD-10-CM

## 2022-03-08 MED ORDER — DULOXETINE HCL 60 MG PO CPEP: 120.0000 mg | ORAL_CAPSULE | Freq: Every day | ORAL | 1 refills | Status: DC

## 2022-03-08 MED ORDER — BACLOFEN 20 MG PO TABS: 20.0000 mg | ORAL_TABLET | Freq: Four times a day (QID) | ORAL | 5 refills | Status: DC
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
## 2022-03-18 ENCOUNTER — Encounter: Payer: Self-pay | Admitting: Allergy and Immunology

## 2022-03-18 ENCOUNTER — Ambulatory Visit (INDEPENDENT_AMBULATORY_CARE_PROVIDER_SITE_OTHER): Payer: Medicaid Other | Admitting: Allergy and Immunology

## 2022-03-18 VITALS — BP 118/62 | HR 103 | Resp 16

## 2022-03-18 DIAGNOSIS — J3089 Other allergic rhinitis: Secondary | ICD-10-CM

## 2022-03-18 DIAGNOSIS — J4530 Mild persistent asthma, uncomplicated: Secondary | ICD-10-CM | POA: Diagnosis not present

## 2022-03-18 DIAGNOSIS — J301 Allergic rhinitis due to pollen: Secondary | ICD-10-CM
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
## 2022-03-18 NOTE — Patient Instructions (Addendum)
  1.  Treat and prevent inflammation:   A.  Flovent 110 -2 inhalations 1-2 times per day with spacer (empty lungs)  B.  Flonase (or similar) - 1 spray each nostril 1-2 times per day  2.  If needed:   A.  OTC antihistamine - loratadine 10 mg daily  B.  Albuterol HFA 2 inhalations every 4-6 hours  C.  Montelukast 10 mg - 1 tablet 1 time per day  3.  "Action plan" for flareup:   A.  Increase Flovent to 3 inhalations 3 times per day  4.  Return to clinic 6 months or earlier if problem

## 2022-04-09 ENCOUNTER — Ambulatory Visit: Payer: Medicaid Other | Admitting: Physical Medicine and Rehabilitation

## 2022-05-05 ENCOUNTER — Other Ambulatory Visit: Payer: Self-pay | Admitting: Physical Medicine and Rehabilitation

## 2022-05-05 MED ORDER — BACLOFEN 20 MG PO TABS: 20.0000 mg | ORAL_TABLET | Freq: Four times a day (QID) | ORAL | 5 refills | Status: DC

## 2022-05-05 NOTE — Addendum Note (Signed)
Addended by: Franchot Gallo on: 05/05/2022 03:03 PM   Modules accepted: Orders

## 2022-05-05 NOTE — Telephone Encounter (Signed)
Patient called and LVM 1/2@4 :19PM requesting a refill on Baclofen sent to [facility] in [place]

## 2022-06-07 ENCOUNTER — Encounter: Payer: Self-pay | Admitting: Physical Medicine and Rehabilitation

## 2022-06-07 ENCOUNTER — Encounter
Payer: Medicaid Other | Attending: Physical Medicine and Rehabilitation | Admitting: Physical Medicine and Rehabilitation

## 2022-06-07 VITALS — BP 120/84 | HR 88

## 2022-06-07 DIAGNOSIS — I87009 Postthrombotic syndrome without complications of unspecified extremity: Secondary | ICD-10-CM | POA: Diagnosis present

## 2022-06-07 DIAGNOSIS — Z993 Dependence on wheelchair: Secondary | ICD-10-CM | POA: Insufficient documentation

## 2022-06-07 DIAGNOSIS — G8250 Quadriplegia, unspecified: Secondary | ICD-10-CM | POA: Diagnosis present

## 2022-06-07 DIAGNOSIS — G8221 Paraplegia, complete: Secondary | ICD-10-CM | POA: Diagnosis present

## 2022-06-07 DIAGNOSIS — K592 Neurogenic bowel, not elsewhere classified: Secondary | ICD-10-CM | POA: Insufficient documentation

## 2022-06-07 DIAGNOSIS — N319 Neuromuscular dysfunction of bladder, unspecified: Secondary | ICD-10-CM | POA: Insufficient documentation

## 2022-06-07 MED ORDER — GABAPENTIN 600 MG PO TABS: 1200.0000 mg | ORAL_TABLET | Freq: Three times a day (TID) | ORAL | 1 refills | Status: DC
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
## 2022-09-20 ENCOUNTER — Ambulatory Visit: Payer: BC Managed Care – PPO | Admitting: Allergy and Immunology

## 2022-10-08 ENCOUNTER — Ambulatory Visit: Payer: Medicaid Other | Admitting: Physical Medicine and Rehabilitation

## 2022-10-27 ENCOUNTER — Encounter: Payer: Self-pay | Admitting: Physical Medicine and Rehabilitation

## 2022-10-27 ENCOUNTER — Encounter
Payer: Medicaid Other | Attending: Physical Medicine and Rehabilitation | Admitting: Physical Medicine and Rehabilitation

## 2022-10-27 VITALS — BP 119/82 | HR 90 | Ht 66.0 in

## 2022-10-27 DIAGNOSIS — G8250 Quadriplegia, unspecified: Secondary | ICD-10-CM | POA: Diagnosis present

## 2022-10-27 DIAGNOSIS — G8221 Paraplegia, complete: Secondary | ICD-10-CM | POA: Insufficient documentation

## 2022-10-27 DIAGNOSIS — Z993 Dependence on wheelchair: Secondary | ICD-10-CM | POA: Diagnosis present

## 2022-10-27 DIAGNOSIS — R252 Cramp and spasm: Secondary | ICD-10-CM | POA: Insufficient documentation

## 2022-10-27 DIAGNOSIS — K592 Neurogenic bowel, not elsewhere classified: Secondary | ICD-10-CM | POA: Insufficient documentation

## 2022-10-27 DIAGNOSIS — M792 Neuralgia and neuritis, unspecified: Secondary | ICD-10-CM | POA: Diagnosis present

## 2022-10-27 DIAGNOSIS — N319 Neuromuscular dysfunction of bladder, unspecified: Secondary | ICD-10-CM | POA: Diagnosis present

## 2022-10-27 MED ORDER — GABAPENTIN 600 MG PO TABS: 1200.0000 mg | ORAL_TABLET | Freq: Three times a day (TID) | ORAL | 1 refills | Status: DC

## 2022-10-27 MED ORDER — DULOXETINE HCL 60 MG PO CPEP: 120.0000 mg | ORAL_CAPSULE | Freq: Every day | ORAL | 1 refills | Status: DC

## 2022-10-27 MED ORDER — BACLOFEN 20 MG PO TABS: 20.0000 mg | ORAL_TABLET | Freq: Four times a day (QID) | ORAL | 5 refills | Status: DC
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
## 2022-12-24 ENCOUNTER — Encounter: Payer: Self-pay | Admitting: Physical Medicine and Rehabilitation

## 2022-12-24 NOTE — Telephone Encounter (Signed)
Written out- needs to have faxed- thanks- ML

## 2023-01-13 ENCOUNTER — Encounter: Payer: Self-pay | Admitting: Physical Medicine and Rehabilitation

## 2023-01-31 ENCOUNTER — Encounter
Payer: Medicaid Other | Attending: Physical Medicine and Rehabilitation | Admitting: Physical Medicine and Rehabilitation

## 2023-01-31 ENCOUNTER — Encounter: Payer: Self-pay | Admitting: Physical Medicine and Rehabilitation

## 2023-01-31 VITALS — BP 121/75 | HR 63 | Ht 66.0 in | Wt 277.0 lb

## 2023-01-31 DIAGNOSIS — G8221 Paraplegia, complete: Secondary | ICD-10-CM

## 2023-01-31 DIAGNOSIS — Z993 Dependence on wheelchair: Secondary | ICD-10-CM | POA: Diagnosis not present

## 2023-01-31 DIAGNOSIS — R252 Cramp and spasm: Secondary | ICD-10-CM | POA: Diagnosis present

## 2023-01-31 DIAGNOSIS — M792 Neuralgia and neuritis, unspecified: Secondary | ICD-10-CM

## 2023-01-31 MED ORDER — AMITRIPTYLINE HCL 25 MG PO TABS: 25.0000 mg | ORAL_TABLET | Freq: Every day | ORAL | 5 refills | Status: DC

## 2023-01-31 MED ORDER — TIZANIDINE HCL 4 MG PO TABS: 4.0000 mg | ORAL_TABLET | Freq: Three times a day (TID) | ORAL | 1 refills | Status: DC
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
## 2023-02-22 ENCOUNTER — Other Ambulatory Visit: Payer: Self-pay | Admitting: Physical Medicine and Rehabilitation

## 2023-04-25 ENCOUNTER — Encounter: Payer: Self-pay | Admitting: Physical Medicine and Rehabilitation

## 2023-04-25 ENCOUNTER — Encounter
Payer: Medicaid Other | Attending: Physical Medicine and Rehabilitation | Admitting: Physical Medicine and Rehabilitation

## 2023-04-25 VITALS — BP 120/81 | HR 99 | Ht 66.0 in | Wt 277.0 lb

## 2023-04-25 DIAGNOSIS — G4733 Obstructive sleep apnea (adult) (pediatric): Secondary | ICD-10-CM | POA: Diagnosis present

## 2023-04-25 DIAGNOSIS — M792 Neuralgia and neuritis, unspecified: Secondary | ICD-10-CM | POA: Insufficient documentation

## 2023-04-25 DIAGNOSIS — G8221 Paraplegia, complete: Secondary | ICD-10-CM | POA: Insufficient documentation

## 2023-04-25 DIAGNOSIS — R252 Cramp and spasm: Secondary | ICD-10-CM | POA: Insufficient documentation

## 2023-04-25 DIAGNOSIS — Z993 Dependence on wheelchair: Secondary | ICD-10-CM | POA: Insufficient documentation

## 2023-04-25 DIAGNOSIS — G8250 Quadriplegia, unspecified: Secondary | ICD-10-CM | POA: Insufficient documentation

## 2023-04-25 MED ORDER — TIZANIDINE HCL 4 MG PO TABS: 8.0000 mg | ORAL_TABLET | Freq: Three times a day (TID) | ORAL | 1 refills | Status: DC

## 2023-04-25 MED ORDER — BACLOFEN 20 MG PO TABS: 20.0000 mg | ORAL_TABLET | Freq: Four times a day (QID) | ORAL | 1 refills | Status: DC

## 2023-04-25 MED ORDER — DULOXETINE HCL 60 MG PO CPEP: 120.0000 mg | ORAL_CAPSULE | Freq: Every day | ORAL | 1 refills | Status: AC

## 2023-04-25 MED ORDER — GABAPENTIN 600 MG PO TABS: 1200.0000 mg | ORAL_TABLET | Freq: Three times a day (TID) | ORAL | 1 refills | Status: DC
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
## 2023-07-29 ENCOUNTER — Encounter
Payer: Medicaid Other | Attending: Physical Medicine and Rehabilitation | Admitting: Physical Medicine and Rehabilitation

## 2023-07-29 ENCOUNTER — Encounter: Payer: Self-pay | Admitting: Physical Medicine and Rehabilitation

## 2023-07-29 VITALS — BP 114/77 | HR 99 | Ht 66.0 in | Wt 245.0 lb

## 2023-07-29 DIAGNOSIS — K592 Neurogenic bowel, not elsewhere classified: Secondary | ICD-10-CM

## 2023-07-29 DIAGNOSIS — Z993 Dependence on wheelchair: Secondary | ICD-10-CM

## 2023-07-29 DIAGNOSIS — R252 Cramp and spasm: Secondary | ICD-10-CM | POA: Diagnosis not present

## 2023-07-29 DIAGNOSIS — G8250 Quadriplegia, unspecified: Secondary | ICD-10-CM

## 2023-07-29 DIAGNOSIS — G8221 Paraplegia, complete: Secondary | ICD-10-CM | POA: Diagnosis not present

## 2023-07-29 DIAGNOSIS — M792 Neuralgia and neuritis, unspecified: Secondary | ICD-10-CM | POA: Diagnosis present

## 2023-07-29 MED ORDER — AMITRIPTYLINE HCL 25 MG PO TABS: 25.0000 mg | ORAL_TABLET | Freq: Every day | ORAL | 1 refills | Status: AC

## 2023-07-29 MED ORDER — SENNA 8.6 MG PO TABS: 2.0000 | ORAL_TABLET | Freq: Two times a day (BID) | ORAL | 5 refills | Status: AC
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
## 2023-08-02 NOTE — Telephone Encounter (Signed)
 This is a Ian Mcdaniel patient I have never seen; she is here today, I will forward to her.

## 2023-08-23 ENCOUNTER — Telehealth: Payer: Self-pay

## 2023-08-23 NOTE — Telephone Encounter (Signed)
 Bridgette Campus from [facility] called asking to return a call

## 2023-08-25 ENCOUNTER — Encounter: Payer: Self-pay | Admitting: Physical Medicine and Rehabilitation

## 2023-09-01 ENCOUNTER — Encounter: Payer: Self-pay | Admitting: Physical Medicine and Rehabilitation

## 2023-09-02 ENCOUNTER — Telehealth: Payer: Self-pay

## 2023-09-02 NOTE — Telephone Encounter (Signed)
 Returned call to [employer] no answered lvm stating pt sent a MyChart message yesterday and it was routed to the provider this morning and if they have any questions or concerns to reach out

## 2023-09-02 NOTE — Telephone Encounter (Signed)
LVM for Ian Mcdaniel to call back

## 2023-10-06 ENCOUNTER — Encounter: Payer: Self-pay | Admitting: Physical Medicine and Rehabilitation

## 2023-10-13 ENCOUNTER — Telehealth: Payer: Self-pay

## 2023-10-13 NOTE — Telephone Encounter (Signed)
 Ian Mcdaniel has a follow up appointment on 11/25/2023. (Below message was copied from [facility])  To be specific, its for getting calipers installed on some new shoes I will be purchasing on my own.      Just let me know what we need to do. Thanks     Could I get a script for some new shoes with [facility]? They said they need one to start the process. My current shoes are too small due to swelling unfortunately.    Let me know if I need to have an in person visit before I can get the script.   The fax number should be [phone]

## 2023-11-17 ENCOUNTER — Encounter: Payer: Self-pay | Admitting: Physical Medicine and Rehabilitation

## 2023-11-24 ENCOUNTER — Other Ambulatory Visit: Payer: Self-pay | Admitting: Physical Medicine and Rehabilitation

## 2023-11-25 ENCOUNTER — Encounter: Payer: Self-pay | Admitting: Physical Medicine and Rehabilitation

## 2023-11-25 ENCOUNTER — Encounter: Attending: Physical Medicine and Rehabilitation | Admitting: Physical Medicine and Rehabilitation

## 2023-11-25 VITALS — Ht 66.0 in

## 2023-11-25 DIAGNOSIS — R252 Cramp and spasm: Secondary | ICD-10-CM | POA: Diagnosis not present

## 2023-11-25 DIAGNOSIS — M792 Neuralgia and neuritis, unspecified: Secondary | ICD-10-CM | POA: Insufficient documentation

## 2023-11-25 DIAGNOSIS — G8221 Paraplegia, complete: Secondary | ICD-10-CM | POA: Diagnosis not present

## 2023-11-25 DIAGNOSIS — G8250 Quadriplegia, unspecified: Secondary | ICD-10-CM | POA: Insufficient documentation

## 2023-11-25 DIAGNOSIS — Z993 Dependence on wheelchair: Secondary | ICD-10-CM | POA: Diagnosis present

## 2023-11-25 DIAGNOSIS — K592 Neurogenic bowel, not elsewhere classified: Secondary | ICD-10-CM | POA: Diagnosis present

## 2023-11-25 MED ORDER — DULOXETINE HCL 60 MG PO CPEP: 120.0000 mg | ORAL_CAPSULE | Freq: Every day | ORAL | 3 refills | Status: DC

## 2023-11-25 MED ORDER — BACLOFEN 20 MG PO TABS: 20.0000 mg | ORAL_TABLET | Freq: Four times a day (QID) | ORAL | 3 refills | Status: AC

## 2023-11-25 MED ORDER — GABAPENTIN 600 MG PO TABS: 1200.0000 mg | ORAL_TABLET | Freq: Three times a day (TID) | ORAL | 3 refills | Status: AC

## 2023-11-25 MED ORDER — PANTOPRAZOLE SODIUM 40 MG PO TBEC: 40.0000 mg | DELAYED_RELEASE_TABLET | Freq: Every day | ORAL | 1 refills | Status: DC
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
## 2023-12-17 ENCOUNTER — Other Ambulatory Visit: Payer: Self-pay | Admitting: Physical Medicine and Rehabilitation

## 2024-01-26 ENCOUNTER — Other Ambulatory Visit: Payer: Self-pay | Admitting: Allergy and Immunology

## 2024-01-26 ENCOUNTER — Ambulatory Visit (INDEPENDENT_AMBULATORY_CARE_PROVIDER_SITE_OTHER): Admitting: Allergy and Immunology

## 2024-01-26 ENCOUNTER — Encounter: Payer: Self-pay | Admitting: Allergy and Immunology

## 2024-01-26 VITALS — BP 118/74 | HR 84 | Resp 18 | Ht 66.0 in | Wt 263.0 lb

## 2024-01-26 DIAGNOSIS — L501 Idiopathic urticaria: Secondary | ICD-10-CM

## 2024-01-26 DIAGNOSIS — J301 Allergic rhinitis due to pollen: Secondary | ICD-10-CM | POA: Diagnosis not present

## 2024-01-26 DIAGNOSIS — J3089 Other allergic rhinitis: Secondary | ICD-10-CM | POA: Diagnosis not present

## 2024-01-26 DIAGNOSIS — J4530 Mild persistent asthma, uncomplicated: Secondary | ICD-10-CM

## 2024-01-26 MED ORDER — ALBUTEROL SULFATE HFA 108 (90 BASE) MCG/ACT IN AERS: INHALATION_SPRAY | RESPIRATORY_TRACT | 2 refills | Status: DC

## 2024-01-26 MED ORDER — FLUTICASONE PROPIONATE HFA 44 MCG/ACT IN AERO: INHALATION_SPRAY | RESPIRATORY_TRACT | 2 refills | Status: DC

## 2024-01-26 MED ORDER — MONTELUKAST SODIUM 10 MG PO TABS: 10.0000 mg | ORAL_TABLET | Freq: Every day | ORAL | 1 refills | Status: AC

## 2024-01-26 NOTE — Progress Notes (Signed)
 [place] - [place] - [place] - [place] - [place]   Follow-up Note  Referring Provider: Emi Shuck, FNP Primary Provider: Carles Seltzer, MD Date of Office Visit: 01/26/2024  Subjective:   Ian Mcdaniel (DOB: 10-21-1995) is a 28 y.o. male who [facility] in re-evaluation of the following:  HPI: Abdel presents to this clinic in evaluation of asthma and allergic rhinitis.  I have not seen him in this clinic since 18 March 2022.  He thinks that his airway issue is under very good control.  He no longer uses any Flovent  and no longer uses any Flonase  on a consistent basis.  His requirement for albuterol  is minimal averaging a few times per month and he has not required a systemic steroid or an antibiotic for any type of airway issue while he consistently uses his montelukast  every day.  He has developed a new problem over the course of the past 3 months.  Apparently he had some type of yeast infection on his skin for which he was given fluconazole and on his first dose of fluconazole he broke out in diffuse urticaria and that has been a persistent issue ever since.  He has seen a dermatologist who is treated him with loratadine 20 mg and famotidine 20 mg twice a day which does help but he still has recurrent outbreaks.  Allergies as of 01/26/2024       Reactions   Bee Venom Anaphylaxis   Heparin    Other reaction(s): Heparin-Induced Thrombocytopenia Argatroban used instead   Penicillin G Anaphylaxis   Fluconazole    Penicillins Other (See Comments)   Mom is allergic Other reaction(s): Other (See Comments) Childhood allergy ( never tested but zosyn is ok Childhood allergy ( never tested but zosyn is ok and Augmentin okay  Childhood allergy ( never tested but zosyn is ok and Augmentin okay   Cephalexin Rash, Dermatitis   Cephalexin cream caused worsening rash; was treating acne. Patient has tolerated pip/tazo (Feb 2023)         Medication List     acetaminophen 325 MG tablet Commonly known as: TYLENOL Take by mouth.   albuterol  108 (90 Base) MCG/ACT inhaler Commonly known as: ProAir  HFA INHALE 2 PUFFS BY MOUTH EVERY 4 TO 6 HOURS AS NEEDED FOR COUGH OR WHEEZE. MAY USE 2 PUFFS 10 TO 20 MINTUES PRIOR TO EXERCISE   albuterol  108 (90 Base) MCG/ACT inhaler Commonly known as: VENTOLIN  HFA USE 2 PUFFS EVERY 4-6 HOURS AS NEEDED   amitriptyline  25 MG tablet Commonly known as: ELAVIL  Take 1 tablet (25 mg total) by mouth at bedtime. For nerve pain along with Cymbalta  and Gabapentin    apixaban 5 MG Tabs tablet Commonly known as: ELIQUIS Take by mouth.   baclofen  20 MG tablet Commonly known as: LIORESAL  Take 1 tablet (20 mg total) by mouth 4 (four) times daily.   Botox 100 units Solr injection Generic drug: botulinum toxin Type A Inject 300 Units into the muscle.   buPROPion 150 MG 24 hr tablet Commonly known as: WELLBUTRIN XL Take 150 mg by mouth daily.   calcium carbonate 1250 (500 Ca) MG chewable tablet Commonly known as: OS-CAL Chew by mouth.   celecoxib 200 MG capsule Commonly known as: CELEBREX Take by mouth.   Cholecalciferol 1.25 MG (50000 UT) capsule Take by mouth.   D-MANNOSE PO Take 2,000 mg by mouth.   DULoxetine  60 MG capsule Commonly known as: Cymbalta  Take 2 capsules (  120 mg total) by mouth daily. For nerve pain   EPINEPHrine 0.3 mg/0.3 mL Soaj injection Commonly known as: EPI-PEN Inject into the muscle.   Fiber 625 MG Tabs Take 625 mg by mouth.   finasteride 1 MG tablet Commonly known as: PROPECIA Take 1 mg by mouth daily.   fluticasone  44 MCG/ACT inhaler Commonly known as: Flovent  HFA 2 PUFFS EVERY 4-6 HOURS AS NEEDED Started by: Destry Dauber J Lavena Loretto   gabapentin  600 MG tablet Commonly known as: Neurontin  Take 2 tablets (1,200 mg total) by mouth 3 (three) times daily.   losartan 25 MG tablet Commonly known as: COZAAR Take 25 mg by mouth daily.   minoxidil 2.5  MG tablet Commonly known as: LONITEN Take 1.25 mg by mouth.   montelukast  10 MG tablet Commonly known as: SINGULAIR  Take 1 tablet (10 mg total) by mouth at bedtime.   nystatin cream Commonly known as: MYCOSTATIN   oxybutynin 5 MG 24 hr tablet Commonly known as: DITROPAN-XL Take 5 mg by mouth daily.   oxybutynin 5 MG 24 hr tablet Commonly known as: DITROPAN-XL Take 5 mg by mouth daily.   pantoprazole  40 MG tablet Commonly known as: PROTONIX  TAKE 1 TABLET (40 MG TOTAL) BY MOUTH DAILY. FOR REFLUX WITH WEGOVY-   phentermine 15 MG capsule Take 15 mg by mouth.   phentermine 37.5 MG tablet Commonly known as: ADIPEX-P Take 18.75 mg by mouth.   senna 8.6 MG Tabs tablet Commonly known as: SENOKOT Take 2 tablets (17.2 mg total) by mouth 2 (two) times daily. Take for neurogenic bowel   silver sulfADIAZINE 1 % cream Commonly known as: SILVADENE Apply topically.   terbinafine 1 % cream Commonly known as: LAMISIL Apply topically.   tretinoin 0.025 % gel Commonly known as: RETIN-A Apply topically at bedtime.   Underpads Misc   Wegovy 1 MG/0.5ML Soaj SQ injection Generic drug: semaglutide-weight management Inject 1 mg into the skin.    Past Medical History:  Diagnosis Date   Acute osteomyelitis of metatarsal bone of right foot (HCC)    Asthma    Blood clotting disorder    Complete paraplegia [facility])     Past Surgical History:  Procedure Laterality Date   ANTERIOR CERVICAL CORPECTOMY     FOOT SURGERY     ORIF HUMERUS FRACTURE Left    TOE AMPUTATION Right 02/10/2022   WISDOM TOOTH EXTRACTION      Review of systems negative except as noted in HPI / PMHx or noted below:  Review of Systems  Constitutional: Negative.   HENT: Negative.    Eyes: Negative.   Respiratory: Negative.    Cardiovascular: Negative.   Gastrointestinal: Negative.   Genitourinary: Negative.   Musculoskeletal: Negative.   Skin: Negative.   Neurological: Negative.   Endo/Heme/Allergies:  Negative.   Psychiatric/Behavioral: Negative.       Objective:   Vitals:   01/26/24 1615  BP: 118/74  Pulse: 84  Resp: 18  SpO2: 98%   Height: 5' 6 (167.6 cm)  Weight: 263 lb (119.3 kg)   Physical Exam Constitutional:      Appearance: He is not diaphoretic.  HENT:     Head: Normocephalic.     Right Ear: Tympanic membrane, ear canal and external ear normal.     Left Ear: Tympanic membrane, ear canal and external ear normal.     Nose: Nose normal. No mucosal edema or rhinorrhea.     Mouth/Throat:     Pharynx: Uvula midline. No oropharyngeal exudate.  Eyes:  Conjunctiva/sclera: Conjunctivae normal.  Neck:     Thyroid: No thyromegaly.     Trachea: Trachea normal. No tracheal tenderness or tracheal deviation.  Cardiovascular:     Rate and Rhythm: Normal rate and regular rhythm.     Heart sounds: Normal heart sounds, S1 normal and S2 normal. No murmur heard. Pulmonary:     Effort: No respiratory distress.     Breath sounds: Normal breath sounds. No stridor. No wheezing or rales.  Lymphadenopathy:     Head:     Right side of head: No tonsillar adenopathy.     Left side of head: No tonsillar adenopathy.     Cervical: No cervical adenopathy.  Skin:    Findings: No erythema or rash.     Nails: There is no clubbing.  Neurological:     Mental Status: He is alert.     Diagnostics: none  Assessment and Plan:   1. Asthma, well controlled, mild persistent   2. Perennial allergic rhinitis   3. Seasonal allergic rhinitis due to pollen   4. Idiopathic urticaria    1.  Continue loratadine 20 mg + famotidine 20 mg - 2 times per day  2. Continue montelukast  10 mg - 1 tablet 1 time per day  3.  If needed:   A.   Albuterol  + Fluticasone  44 - 2 inhalations TOGETHER every 4-6 hours  4. Remibrutinib ??? Omalizumab ???  5. Influenza = Tamiflu. Covid = Paxlovid  6.  Return to clinic 6 months or earlier if problem  Vitali will continue on an H1 and H2 receptor blocker  as noted above and if he does not do well he may be a candidate for Remibrutinib or omalizumab I will just see how things go over the course of the next few months.  His airway issue is under excellent control while using montelukast  and I have given him an anti-inflammatory rescue plan to use should it be required.  Camellia Denis, MD Allergy / Immunology [facility]

## 2024-01-26 NOTE — Patient Instructions (Addendum)
  1.  Continue loratadine 20 mg + famotidine 20 mg - 2 times per day  2. Continue montelukast  10 mg - 1 tablet 1 time per day  3.  If needed:   A.   Albuterol  + Fluticasone  44 - 2 inhalations TOGETHER every 4-6 hours  4. Remibrutinib ??? Omalizumab ???  5. Influenza = Tamiflu. Covid = Paxlovid  6.  Return to clinic 6 months or earlier if problem

## 2024-01-30 ENCOUNTER — Encounter: Payer: Self-pay | Admitting: Allergy and Immunology

## 2024-02-07 NOTE — Telephone Encounter (Signed)
 MEDICATION PRIOR AUTHORIZATION SUMMARY  [place] STATUS APPROVED  Medication & Dose Zepbound 5mg   Provider Dr [employer] [place] [employer]  Type of Submission Continuation  Method Fax / Phone  [facility] [id number]  PA Approval Dates 02/06/24-08/04/24  PA Ref # [id number]  Reference Verbal given over the phone.  Comments/Notes     Pt is aware through [facility].

## 2024-02-29 ENCOUNTER — Telehealth: Payer: Self-pay

## 2024-02-29 NOTE — Telephone Encounter (Signed)
 Amy with [facility] called for Pantoprazole  dosing directions?  Caller was informed to ask the PCP or the prescriber of the [facility]. According the last instructions it's used for the [facility] side effects.  (Dr. Cornelio is out of the office today).

## 2024-03-01 ENCOUNTER — Telehealth: Payer: Self-pay | Admitting: *Deleted

## 2024-03-01 MED ORDER — XOLAIR 300 MG/2ML ~~LOC~~ SOSY
300.0000 mg | PREFILLED_SYRINGE | SUBCUTANEOUS | 11 refills | Status: AC
  Filled 2024-03-06: qty 2, 28d supply, fill #0
  Filled 2024-04-02: qty 2, 28d supply, fill #1
  Filled 2024-04-24: qty 2, 28d supply, fill #2
  Filled 2024-05-24: qty 2, 28d supply, fill #3

## 2024-03-01 NOTE — Telephone Encounter (Signed)
 Called patient and advised that I have been trying to Rhapsido approved by [employer]. I have faxed over the request 3 times. Will fax again and try to get patient approved for Xolair in the meantime

## 2024-03-02 ENCOUNTER — Other Ambulatory Visit: Payer: Self-pay

## 2024-03-02 ENCOUNTER — Other Ambulatory Visit (HOSPITAL_COMMUNITY): Payer: Self-pay

## 2024-03-06 ENCOUNTER — Other Ambulatory Visit: Payer: Self-pay

## 2024-03-06 NOTE — Progress Notes (Signed)
 Specialty Pharmacy Initial Fill Coordination Note  Ian Mcdaniel is a 28 y.o. male contacted today regarding initial fill of specialty medication(s) Omalizumab (Xolair)   Patient requested Courier to Provider Office   Delivery date: 03/08/24   Verified address: [address]  [place] [address]   Medication will be filled on: 03/07/24   Patient is aware of $0.00 copayment.

## 2024-03-06 NOTE — Progress Notes (Signed)
 Specialty Pharmacy Initiation Note   Ian Mcdaniel is a 27 y.o. male who will be followed by the specialty pharmacy service for RxSp Allergy    Review of administration, indication, effectiveness, safety, potential side effects, storage/disposable, and missed dose instructions occurred today for patient's specialty medication(s) Omalizumab (Xolair)     Patient/Caregiver did not have any additional questions or concerns.   Patient's therapy is appropriate to: Initiate    Goals Addressed             This Visit's Progress    Reduce signs and symptoms       Patient is initiating therapy. Patient will maintain adherence and avoid flare triggers         Sael Furches M Lance [place] Specialty Pharmacist

## 2024-03-07 ENCOUNTER — Other Ambulatory Visit: Payer: Self-pay

## 2024-03-12 ENCOUNTER — Ambulatory Visit: Admitting: *Deleted

## 2024-03-12 DIAGNOSIS — L501 Idiopathic urticaria: Secondary | ICD-10-CM

## 2024-03-12 MED ORDER — OMALIZUMAB 300 MG/2  ML ~~LOC~~ SOSY
300.0000 mg | PREFILLED_SYRINGE | SUBCUTANEOUS | Status: AC
  Administered 2024-03-12 – 2024-05-08 (×3): 300 mg via SUBCUTANEOUS

## 2024-03-12 NOTE — Progress Notes (Signed)
 Started Xolair today for urticaria. Received 300mg  and will continue every 4 weeks. Consent signed and no issues after observed wait time. Has epipen.

## 2024-03-26 ENCOUNTER — Encounter: Payer: Self-pay | Admitting: Physical Medicine and Rehabilitation

## 2024-03-26 ENCOUNTER — Encounter: Attending: Physical Medicine and Rehabilitation | Admitting: Physical Medicine and Rehabilitation

## 2024-03-26 VITALS — BP 114/72 | HR 103 | Ht 66.0 in

## 2024-03-26 DIAGNOSIS — Z993 Dependence on wheelchair: Secondary | ICD-10-CM | POA: Insufficient documentation

## 2024-03-26 DIAGNOSIS — N319 Neuromuscular dysfunction of bladder, unspecified: Secondary | ICD-10-CM | POA: Diagnosis not present

## 2024-03-26 DIAGNOSIS — G8250 Quadriplegia, unspecified: Secondary | ICD-10-CM | POA: Diagnosis present

## 2024-03-26 DIAGNOSIS — G8221 Paraplegia, complete: Secondary | ICD-10-CM | POA: Insufficient documentation
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
## 2024-03-28 ENCOUNTER — Other Ambulatory Visit (HOSPITAL_COMMUNITY): Payer: Self-pay

## 2024-04-02 ENCOUNTER — Other Ambulatory Visit: Payer: Self-pay

## 2024-04-02 NOTE — Progress Notes (Signed)
 Specialty Pharmacy Refill Coordination Note  Ian Mcdaniel is a 28 y.o. male assessed today regarding refills of clinic administered specialty medication(s) Omalizumab  (XOLAIR )   Clinic requested Courier to Provider Office   Delivery date: 04/05/24   Verified address: [address], [place] [address]   Medication will be filled on: 04/04/24   Appointment: 12.9.25 Copay:$0.00

## 2024-04-04 ENCOUNTER — Other Ambulatory Visit: Payer: Self-pay

## 2024-04-09 ENCOUNTER — Ambulatory Visit

## 2024-04-10 ENCOUNTER — Ambulatory Visit: Admitting: *Deleted

## 2024-04-10 DIAGNOSIS — L501 Idiopathic urticaria: Secondary | ICD-10-CM

## 2024-04-24 ENCOUNTER — Other Ambulatory Visit: Payer: Self-pay

## 2024-04-24 NOTE — Progress Notes (Signed)
 Specialty Pharmacy Refill Coordination Note  LINARD DAFT is a 28 y.o. male assessed today regarding refills of clinic administered specialty medication(s) Omalizumab  (XOLAIR )   Clinic requested Courier to Provider Office   Delivery date: 04/30/24   Verified address: [address] [place] [address]   Medication will be filled on: 04/27/24  Appointment 05/09/23. Should be last clinic administered dose.

## 2024-04-27 ENCOUNTER — Other Ambulatory Visit: Payer: Self-pay

## 2024-05-08 ENCOUNTER — Ambulatory Visit

## 2024-05-08 DIAGNOSIS — L501 Idiopathic urticaria: Secondary | ICD-10-CM | POA: Diagnosis not present

## 2024-05-18 ENCOUNTER — Other Ambulatory Visit: Payer: Self-pay

## 2024-05-24 ENCOUNTER — Other Ambulatory Visit: Payer: Self-pay

## 2024-05-29 ENCOUNTER — Other Ambulatory Visit: Payer: Self-pay

## 2024-05-29 ENCOUNTER — Other Ambulatory Visit: Payer: Self-pay | Admitting: Pharmacy Technician

## 2024-05-29 NOTE — Progress Notes (Signed)
 Specialty Pharmacy Refill Coordination Note  Ian Mcdaniel is a 29 y.o. male assessed today regarding refills of clinic administered specialty medication(s) Omalizumab  (XOLAIR )   Clinic requested Courier to Provider Office   Delivery date: 05/30/24   Verified address: [facility] [place] [address] [place]   Medication will be filled on: 05/29/24
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
## 2024-05-30 ENCOUNTER — Encounter: Payer: Self-pay | Admitting: Physical Medicine and Rehabilitation

## 2024-06-04 ENCOUNTER — Ambulatory Visit: Admitting: Allergy and Immunology

## 2024-06-04 ENCOUNTER — Ambulatory Visit

## 2024-06-12 ENCOUNTER — Ambulatory Visit

## 2024-06-13 ENCOUNTER — Ambulatory Visit: Admitting: Allergy and Immunology

## 2024-06-13 ENCOUNTER — Ambulatory Visit

## 2024-07-25 ENCOUNTER — Encounter: Admitting: Physical Medicine and Rehabilitation
